# Patient Record
Sex: Female | Born: 1975 | Hispanic: Yes | Marital: Single | Smoking: Never smoker
Health system: Southern US, Community
[De-identification: ages and names within clinical notes are randomized; demographics above are authoritative.]

## PROBLEM LIST (undated history)

## (undated) HISTORY — PX: CHOLECYSTECTOMY: SHX55

---

## 2006-11-05 ENCOUNTER — Observation Stay: Payer: Self-pay

## 2006-11-05 ENCOUNTER — Ambulatory Visit: Payer: Self-pay | Admitting: Family Medicine

## 2007-09-25 ENCOUNTER — Emergency Department: Payer: Self-pay | Admitting: Emergency Medicine

## 2007-09-26 ENCOUNTER — Emergency Department: Payer: Self-pay | Admitting: Unknown Physician Specialty

## 2007-09-26 ENCOUNTER — Other Ambulatory Visit: Payer: Self-pay

## 2007-09-27 ENCOUNTER — Ambulatory Visit: Payer: Self-pay | Admitting: Obstetrics and Gynecology

## 2014-04-27 ENCOUNTER — Ambulatory Visit: Payer: Self-pay | Admitting: Family Medicine

## 2014-12-25 ENCOUNTER — Other Ambulatory Visit: Payer: Self-pay | Admitting: Family Medicine

## 2014-12-25 DIAGNOSIS — Z3481 Encounter for supervision of other normal pregnancy, first trimester: Secondary | ICD-10-CM

## 2014-12-29 ENCOUNTER — Ambulatory Visit
Admission: RE | Admit: 2014-12-29 | Discharge: 2014-12-29 | Disposition: A | Discharge status: Home / Self Care | Payer: Self-pay | Source: Ambulatory Visit | Attending: Family Medicine | Admitting: Family Medicine

## 2014-12-29 DIAGNOSIS — Z3401 Encounter for supervision of normal first pregnancy, first trimester: Secondary | ICD-10-CM | POA: Insufficient documentation

## 2014-12-29 DIAGNOSIS — Z3481 Encounter for supervision of other normal pregnancy, first trimester: Secondary | ICD-10-CM

## 2014-12-29 DIAGNOSIS — Z3A01 Less than 8 weeks gestation of pregnancy: Secondary | ICD-10-CM | POA: Insufficient documentation

## 2015-03-15 DIAGNOSIS — IMO0002 Reserved for concepts with insufficient information to code with codable children: Secondary | ICD-10-CM | POA: Insufficient documentation

## 2015-03-15 DIAGNOSIS — O09522 Supervision of elderly multigravida, second trimester: Secondary | ICD-10-CM | POA: Insufficient documentation

## 2019-05-13 ENCOUNTER — Ambulatory Visit: Payer: Self-pay

## 2019-05-13 ENCOUNTER — Ambulatory Visit: Payer: Self-pay | Attending: Internal Medicine

## 2019-05-13 DIAGNOSIS — Z23 Encounter for immunization: Secondary | ICD-10-CM

## 2019-05-13 NOTE — Progress Notes (Signed)
   Covid-19 Vaccination Clinic  Name:  Altair Stanko    MRN: [id number] DOB: 1975-06-17  05/13/2019  Ms. [name] Sondra Come was observed post Covid-19 immunization for 15 minutes without incident. She was provided with Vaccine Information Sheet and instruction to access the V-Safe system.   Ms. Tahlia Deamer was instructed to call 911 with any severe reactions post vaccine: Marland Kitchen Difficulty breathing  . Swelling of face and throat  . A fast heartbeat  . A bad rash all over body  . Dizziness and weakness   Immunizations Administered    Name Date Dose VIS Date Route   Pfizer COVID-19 Vaccine 05/13/2019  4:22 PM 0.3 mL 01/28/2019 Intramuscular   Manufacturer: [employer], [employer]   Lot: [id number]   NDC: [address]-5

## 2019-06-03 ENCOUNTER — Ambulatory Visit: Payer: Self-pay

## 2019-06-03 ENCOUNTER — Ambulatory Visit: Payer: Self-pay | Attending: Internal Medicine

## 2019-06-03 DIAGNOSIS — Z23 Encounter for immunization: Secondary | ICD-10-CM

## 2019-06-03 NOTE — Progress Notes (Signed)
   Covid-19 Vaccination Clinic  Name:  Abigail Chaney    MRN: [id number] DOB: February 27, 1975  06/03/2019  Ms. [name] Abigail Chaney was observed post Covid-19 immunization for 15 minutes without incident. She was provided with Vaccine Information Sheet and instruction to access the V-Safe system.   Ms. [name] was instructed to call 911 with any severe reactions post vaccine: Marland Kitchen Difficulty breathing  . Swelling of face and throat  . A fast heartbeat  . A bad rash all over body  . Dizziness and weakness   Immunizations Administered    Name Date Dose VIS Date Route   Pfizer COVID-19 Vaccine 06/03/2019  4:25 PM 0.3 mL 01/28/2019 Intramuscular   Manufacturer: [employer], [employer]   Lot: [id number]   NDC: [address]-9

## 2019-08-15 ENCOUNTER — Emergency Department: Payer: Self-pay

## 2019-08-15 ENCOUNTER — Encounter: Payer: Self-pay | Admitting: Emergency Medicine

## 2019-08-15 ENCOUNTER — Inpatient Hospital Stay
Admission: EM | Admit: 2019-08-15 | Discharge: 2019-08-17 | DRG: 419 | Disposition: A | Discharge status: Home / Self Care | Payer: Self-pay | Attending: General Surgery | Admitting: General Surgery

## 2019-08-15 ENCOUNTER — Other Ambulatory Visit: Payer: Self-pay

## 2019-08-15 DIAGNOSIS — K819 Cholecystitis, unspecified: Secondary | ICD-10-CM

## 2019-08-15 DIAGNOSIS — K8000 Calculus of gallbladder with acute cholecystitis without obstruction: Principal | ICD-10-CM | POA: Diagnosis present

## 2019-08-15 DIAGNOSIS — Z20822 Contact with and (suspected) exposure to covid-19: Secondary | ICD-10-CM | POA: Diagnosis present

## 2019-08-15 LAB — COMPREHENSIVE METABOLIC PANEL
ALT: 20 U/L (ref 0–44)
AST: 19 U/L (ref 15–41)
Albumin: 4.3 g/dL (ref 3.5–5.0)
Alkaline Phosphatase: 75 U/L (ref 38–126)
Anion gap: 10 (ref 5–15)
BUN: 8 mg/dL (ref 6–20)
CO2: 24 mmol/L (ref 22–32)
Calcium: 9 mg/dL (ref 8.9–10.3)
Chloride: 102 mmol/L (ref 98–111)
Creatinine, Ser: 0.51 mg/dL (ref 0.44–1.00)
GFR calc Af Amer: >60 mL/min (ref >60)
GFR calc non Af Amer: >60 mL/min (ref >60)
Glucose, Bld: 119 mg/dL — ABNORMAL HIGH (ref 70–99)
Potassium: 3.9 mmol/L (ref 3.5–5.1)
Sodium: 136 mmol/L (ref 135–145)
Total Bilirubin: 1 mg/dL (ref 0.3–1.2)
Total Protein: 8 g/dL (ref 6.5–8.1)

## 2019-08-15 LAB — CBC
HCT: 40.2 % (ref 36.0–46.0)
Hemoglobin: 13.7 g/dL (ref 12.0–15.0)
MCH: 29.3 pg (ref 26.0–34.0)
MCHC: 34.1 g/dL (ref 30.0–36.0)
MCV: 85.9 fL (ref 80.0–100.0)
Platelets: 266 10*3/uL (ref 150–400)
RBC: 4.68 MIL/uL (ref 3.87–5.11)
RDW: 12.5 % (ref 11.5–15.5)
WBC: 14.2 10*3/uL — ABNORMAL HIGH (ref 4.0–10.5)
nRBC: 0 % (ref 0.0–0.2)

## 2019-08-15 LAB — URINALYSIS, COMPLETE (UACMP) WITH MICROSCOPIC
Bacteria, UA: NONE SEEN
Bilirubin Urine: NEGATIVE
Glucose, UA: NEGATIVE mg/dL
Ketones, ur: NEGATIVE mg/dL
Leukocytes,Ua: NEGATIVE
Nitrite: NEGATIVE
Protein, ur: NEGATIVE mg/dL
Specific Gravity, Urine: 1.009 (ref 1.005–1.030)
pH: 7 (ref 5.0–8.0)

## 2019-08-15 LAB — LIPASE, BLOOD: Lipase: 24 U/L (ref 11–51)

## 2019-08-15 LAB — SARS CORONAVIRUS 2 BY RT PCR (HOSPITAL ORDER, PERFORMED IN ~~LOC~~ HOSPITAL LAB): SARS Coronavirus 2: NEGATIVE

## 2019-08-15 LAB — POCT PREGNANCY, URINE: Preg Test, Ur: NEGATIVE

## 2019-08-15 LAB — TROPONIN I (HIGH SENSITIVITY)
Troponin I (High Sensitivity): <2 ng/L (ref <18)
Troponin I (High Sensitivity): 4 ng/L (ref <18)

## 2019-08-15 MED ORDER — ONDANSETRON 4 MG PO TBDP: 4.0000 mg | ORAL_TABLET | Freq: Four times a day (QID) | ORAL | Status: DC | PRN

## 2019-08-15 MED ORDER — KETOROLAC TROMETHAMINE 30 MG/ML IJ SOLN
30.0000 mg | Freq: Four times a day (QID) | INTRAMUSCULAR | Status: DC | PRN
  Administered 2019-08-16 – 2019-08-17 (×2): 30 mg via INTRAVENOUS
  Filled 2019-08-15 (×2): qty 1

## 2019-08-15 MED ORDER — MORPHINE SULFATE (PF) 4 MG/ML IV SOLN
4.0000 mg | Freq: Once | INTRAVENOUS | Status: AC
  Administered 2019-08-15: 4 mg via INTRAVENOUS
  Filled 2019-08-15: qty 1

## 2019-08-15 MED ORDER — HYDROMORPHONE HCL 1 MG/ML IJ SOLN
INTRAMUSCULAR | Status: AC
  Filled 2019-08-15: qty 1

## 2019-08-15 MED ORDER — SIMETHICONE 80 MG PO CHEW
40.0000 mg | CHEWABLE_TABLET | Freq: Four times a day (QID) | ORAL | Status: DC | PRN
  Filled 2019-08-15: qty 1

## 2019-08-15 MED ORDER — PIPERACILLIN-TAZOBACTAM 4.5 G IVPB: 4.5000 g | Freq: Once | INTRAVENOUS | Status: DC

## 2019-08-15 MED ORDER — ONDANSETRON HCL 4 MG/2ML IJ SOLN: 4.0000 mg | Freq: Four times a day (QID) | INTRAMUSCULAR | Status: DC | PRN

## 2019-08-15 MED ORDER — IOHEXOL 350 MG/ML SOLN
75.0000 mL | Freq: Once | INTRAVENOUS | Status: AC | PRN
  Administered 2019-08-15: 75 mL via INTRAVENOUS
  Filled 2019-08-15: qty 75

## 2019-08-15 MED ORDER — ONDANSETRON HCL 4 MG/2ML IJ SOLN
4.0000 mg | Freq: Once | INTRAMUSCULAR | Status: AC
  Administered 2019-08-15: 4 mg via INTRAVENOUS
  Filled 2019-08-15: qty 2

## 2019-08-15 MED ORDER — HYDROMORPHONE HCL 1 MG/ML IJ SOLN
0.5000 mg | INTRAMUSCULAR | Status: DC | PRN
  Administered 2019-08-15 – 2019-08-16 (×5): 0.5 mg via INTRAVENOUS
  Filled 2019-08-15 (×4): qty 0.5

## 2019-08-15 MED ORDER — PIPERACILLIN-TAZOBACTAM 3.375 G IVPB
3.3750 g | Freq: Three times a day (TID) | INTRAVENOUS | Status: DC
  Administered 2019-08-15 – 2019-08-17 (×4): 3.375 g via INTRAVENOUS
  Filled 2019-08-15 (×4): qty 50

## 2019-08-15 NOTE — ED Triage Notes (Signed)
Through hospital spanish interpreter:  Patient presents to the ED with bilateral flank pain radiating into lower abdomen x 3 days.  Patient reports urinary frequency, denies dysuria.  Patient denies vomiting and diarrhea.  Patient appears anxious in triage and patient states she is very short of breath.  Patient began complaining of tightness in her chest and shortness of breath as well during triage.  Patient also tearful.

## 2019-08-15 NOTE — ED Notes (Signed)
Attempted to call report at this time 

## 2019-08-15 NOTE — ED Provider Notes (Signed)
Emergency Department Provider Note  ____________________________________________  Time seen: Approximately 7:12 PM  I have reviewed the triage vital signs and the nursing notes.   HISTORY  Chief Complaint Flank Pain and Chest Pain   Historian Patient     HPI Abigail Chaney is a 44 y.o. female presents to the emergency department with bilateral flank pain that radiates to the chest for the past two days.  Patient states that pain was radiating into her abdomen yesterday but states that she has mostly feeling pain in her upper chest.  She states that pain makes her breathless. She has not been able to eat due to the pain. She denies fever and chills.  No nausea, vomiting or diarrhea.  No dysuria or hematuria at home.  She states that she was seen by her primary care provider and was diagnosed with musculoskeletal pain.  She states that she has had similar pain in the past but cannot remember how it was managed.  She denies recent admissions and states that overall she is pretty healthy.  She denies swelling in the bilateral legs.  Denies recent travel, daily smoking or recent surgery.  Denies a history of DVT or PE.   History reviewed. No pertinent past medical history.   Immunizations up to date:  Yes.     History reviewed. No pertinent past medical history.  Patient Active Problem List   Diagnosis Date Noted  . Acute calculous cholecystitis 08/15/2019    History reviewed. No pertinent surgical history.  Prior to Admission medications   Not on File    Allergies Patient has no known allergies.  No family history on file.  Social History Social History   Tobacco Use  . Smoking status: Not on file  Substance Use Topics  . Alcohol use: Not on file  . Drug use: Not on file     Review of Systems  Constitutional: No fever/chills Eyes:  No discharge ENT: No upper respiratory complaints. Respiratory: no cough. No SOB/ use of accessory muscles to  breath Cardiovascular: Patient has chest pain.  Gastrointestinal:   No nausea, no vomiting.  No diarrhea.  No constipation. Musculoskeletal: Negative for musculoskeletal pain. Skin: Negative for rash, abrasions, lacerations, ecchymosis.   ____________________________________________   PHYSICAL EXAM:  VITAL SIGNS: ED Triage Vitals  Enc Vitals Group     BP 08/15/19 1536 (!) 175/89     Pulse Rate 08/15/19 1536 (!) 117     Resp 08/15/19 1536 18     Temp 08/15/19 1536 98.7 F (37.1 C)     Temp Source 08/15/19 1536 Oral     SpO2 08/15/19 1536 98 %     Weight 08/15/19 1536 165 lb (74.8 kg)     Height 08/15/19 1536 5\' 5"  (1.651 m)     Head Circumference --      Peak Flow --      Pain Score 08/15/19 1535 9     Pain Loc --      Pain Edu? --      Excl. in GC? --      Constitutional: Alert and oriented.  Patient seems uncomfortable and leaning forward. Eyes: Conjunctivae are normal. PERRL. EOMI. Head: Atraumatic. ENT:      Nose: No congestion/rhinnorhea.      Mouth/Throat: Mucous membranes are moist.  Neck: No stridor.  No cervical spine tenderness to palpation. Cardiovascular: Normal rate, regular rhythm. Normal S1 and S2.  Good peripheral circulation. Respiratory: Normal respiratory effort without tachypnea or retractions.  Lungs CTAB. Good air entry to the bases with no decreased or absent breath sounds Gastrointestinal: Bowel sounds x 4 quadrants.  Patient has tenderness to palpation in right upper and left upper abdominal quadrants with no guarding or rigidity. No distention. Musculoskeletal: Full range of motion to all extremities. No obvious deformities noted Neurologic:  Normal for age. No gross focal neurologic deficits are appreciated.  Skin:  Skin is warm, dry and intact. No rash noted. Psychiatric: Mood and affect are normal for age. Speech and behavior are normal.   ____________________________________________   LABS (all labs ordered are listed, but only abnormal  results are displayed)  Labs Reviewed  CBC - Abnormal; Notable for the following components:      Result Value   WBC 14.2 (*)    All other components within normal limits  URINALYSIS, COMPLETE (UACMP) WITH MICROSCOPIC - Abnormal; Notable for the following components:   Color, Urine STRAW (*)    APPearance CLEAR (*)    Hgb urine dipstick SMALL (*)    All other components within normal limits  COMPREHENSIVE METABOLIC PANEL - Abnormal; Notable for the following components:   Glucose, Bld 119 (*)    All other components within normal limits  SARS CORONAVIRUS 2 BY RT PCR (HOSPITAL ORDER, PERFORMED IN [facility])  LIPASE, BLOOD  HIV ANTIBODY (ROUTINE TESTING W REFLEX)  CBC  COMPREHENSIVE METABOLIC PANEL  MAGNESIUM  PHOSPHORUS  POC URINE PREG, ED  POCT PREGNANCY, URINE  TROPONIN I (HIGH SENSITIVITY)  TROPONIN I (HIGH SENSITIVITY)   ____________________________________________  EKG   ____________________________________________  RADIOLOGY Geraldo Pitter, personally viewed and evaluated these images (plain radiographs) as part of my medical decision making, as well as reviewing the written report by the radiologist.  DG Chest 2 View  Result Date: 08/15/2019 CLINICAL DATA:  Chest pain. Additional provided: Bilateral flank pain radiating into lower abdomen for 3 days, urinary frequency. EXAM: CHEST - 2 VIEW COMPARISON:  Prior chest radiograph 04/27/2014 FINDINGS: Heart size within normal limits. There is no appreciable airspace consolidation. No evidence of pleural effusion or pneumothorax. No acute bony abnormality identified. IMPRESSION: No evidence of acute cardiopulmonary abnormality. Electronically Signed   By: Jackey Loge DO   On: 08/15/2019 16:23   CT Angio Chest PE W and/or Wo Contrast  Result Date: 08/15/2019 CLINICAL DATA:  Abdominal distension. Shortness of breath. Flank pain and epigastric pain. EXAM: CT ANGIOGRAPHY CHEST CT ABDOMEN AND PELVIS WITH  CONTRAST TECHNIQUE: Multidetector CT imaging of the chest was performed using the standard protocol during bolus administration of intravenous contrast. Multiplanar CT image reconstructions and MIPs were obtained to evaluate the vascular anatomy. Multidetector CT imaging of the abdomen and pelvis was performed using the standard protocol during bolus administration of intravenous contrast. CONTRAST:  63mL OMNIPAQUE IOHEXOL 350 MG/ML SOLN COMPARISON:  None. FINDINGS: CTA CHEST FINDINGS Cardiovascular: Examination is slightly limited by respiratory motion artifact. There is no pulmonary embolus or evidence of right heart strain. The size of the main pulmonary artery is normal. Heart size is normal, with no pericardial effusion. The course and caliber of the aorta are normal. There is no atherosclerotic calcification. Opacification decreased due to pulmonary arterial phase contrast bolus timing. Mediastinum/Nodes: -- No mediastinal lymphadenopathy. -- No hilar lymphadenopathy. -- No axillary lymphadenopathy. -- No supraclavicular lymphadenopathy. -- Normal thyroid gland where visualized. -  Unremarkable esophagus. Lungs/Pleura: Airways are patent. No pleural effusion, lobar consolidation, pneumothorax or pulmonary infarction. Musculoskeletal: No chest wall abnormality. No bony  spinal canal stenosis. Review of the MIP images confirms the above findings. CT ABDOMEN and PELVIS FINDINGS Hepatobiliary: The liver is normal. The gallbladder is distended. Multiple gallstones are noted. There appears to be some gallbladder wall thickening.There is no biliary ductal dilation. Pancreas: Normal contours without ductal dilatation. No peripancreatic fluid collection. Spleen: Unremarkable. Adrenals/Urinary Tract: --Adrenal glands: Unremarkable. --Right kidney/ureter: No hydronephrosis or radiopaque kidney stones. --Left kidney/ureter: No hydronephrosis or radiopaque kidney stones. --Urinary bladder: Unremarkable. Stomach/Bowel:  --Stomach/Duodenum: No hiatal hernia or other gastric abnormality. Normal duodenal course and caliber. --Small bowel: Unremarkable. --Colon: Unremarkable. --Appendix: Normal. Vascular/Lymphatic: Normal course and caliber of the major abdominal vessels. --No retroperitoneal lymphadenopathy. --No mesenteric lymphadenopathy. --No pelvic or inguinal lymphadenopathy. Reproductive: There is an IUD in place. Other: No ascites or free air. The abdominal wall is normal. Musculoskeletal. No acute displaced fractures. Review of the MIP images confirms the above findings. IMPRESSION: 1. Cholelithiasis with findings suspicious for acute cholecystitis. Follow-up with ultrasound is recommended. 2. No acute pulmonary embolism.  The lungs are clear. Electronically Signed   By: Katherine Mantle M.D.   On: 08/15/2019 20:34   CT ABDOMEN PELVIS W CONTRAST  Result Date: 08/15/2019 CLINICAL DATA:  Abdominal distension. Shortness of breath. Flank pain and epigastric pain. EXAM: CT ANGIOGRAPHY CHEST CT ABDOMEN AND PELVIS WITH CONTRAST TECHNIQUE: Multidetector CT imaging of the chest was performed using the standard protocol during bolus administration of intravenous contrast. Multiplanar CT image reconstructions and MIPs were obtained to evaluate the vascular anatomy. Multidetector CT imaging of the abdomen and pelvis was performed using the standard protocol during bolus administration of intravenous contrast. CONTRAST:  19mL OMNIPAQUE IOHEXOL 350 MG/ML SOLN COMPARISON:  None. FINDINGS: CTA CHEST FINDINGS Cardiovascular: Examination is slightly limited by respiratory motion artifact. There is no pulmonary embolus or evidence of right heart strain. The size of the main pulmonary artery is normal. Heart size is normal, with no pericardial effusion. The course and caliber of the aorta are normal. There is no atherosclerotic calcification. Opacification decreased due to pulmonary arterial phase contrast bolus timing. Mediastinum/Nodes:  -- No mediastinal lymphadenopathy. -- No hilar lymphadenopathy. -- No axillary lymphadenopathy. -- No supraclavicular lymphadenopathy. -- Normal thyroid gland where visualized. -  Unremarkable esophagus. Lungs/Pleura: Airways are patent. No pleural effusion, lobar consolidation, pneumothorax or pulmonary infarction. Musculoskeletal: No chest wall abnormality. No bony spinal canal stenosis. Review of the MIP images confirms the above findings. CT ABDOMEN and PELVIS FINDINGS Hepatobiliary: The liver is normal. The gallbladder is distended. Multiple gallstones are noted. There appears to be some gallbladder wall thickening.There is no biliary ductal dilation. Pancreas: Normal contours without ductal dilatation. No peripancreatic fluid collection. Spleen: Unremarkable. Adrenals/Urinary Tract: --Adrenal glands: Unremarkable. --Right kidney/ureter: No hydronephrosis or radiopaque kidney stones. --Left kidney/ureter: No hydronephrosis or radiopaque kidney stones. --Urinary bladder: Unremarkable. Stomach/Bowel: --Stomach/Duodenum: No hiatal hernia or other gastric abnormality. Normal duodenal course and caliber. --Small bowel: Unremarkable. --Colon: Unremarkable. --Appendix: Normal. Vascular/Lymphatic: Normal course and caliber of the major abdominal vessels. --No retroperitoneal lymphadenopathy. --No mesenteric lymphadenopathy. --No pelvic or inguinal lymphadenopathy. Reproductive: There is an IUD in place. Other: No ascites or free air. The abdominal wall is normal. Musculoskeletal. No acute displaced fractures. Review of the MIP images confirms the above findings. IMPRESSION: 1. Cholelithiasis with findings suspicious for acute cholecystitis. Follow-up with ultrasound is recommended. 2. No acute pulmonary embolism.  The lungs are clear. Electronically Signed   By: Katherine Mantle M.D.   On: 08/15/2019 20:34    ____________________________________________  PROCEDURES  Procedure(s) performed:      Procedures     Medications  piperacillin-tazobactam (ZOSYN) IVPB 3.375 g (has no administration in time range)  ketorolac (TORADOL) 30 MG/ML injection 30 mg (has no administration in time range)  HYDROmorphone (DILAUDID) injection 0.5 mg (has no administration in time range)  ondansetron (ZOFRAN-ODT) disintegrating tablet 4 mg (has no administration in time range)    Or  ondansetron (ZOFRAN) injection 4 mg (has no administration in time range)  simethicone (MYLICON) chewable tablet 40 mg (has no administration in time range)  morphine 4 MG/ML injection 4 mg (4 mg Intravenous Given 08/15/19 1922)  ondansetron (ZOFRAN) injection 4 mg (4 mg Intravenous Given 08/15/19 1922)  iohexol (OMNIPAQUE) 350 MG/ML injection 75 mL (75 mLs Intravenous Contrast Given 08/15/19 2009)     ____________________________________________   INITIAL IMPRESSION / ASSESSMENT AND PLAN / ED COURSE  Pertinent labs & imaging results that were available during my care of the patient were reviewed by me and considered in my medical decision making (see chart for details).      Assessment and plan:  Cholelithiasis with cholecystitis 44 year old female presents to the emergency department with bilateral flank pain that radiates to the chest for the past 2 days.  Patient was hypertensive and tachycardic in triage but her vital signs were otherwise stable.  On physical exam, she did have tenderness to palpation the right and left upper abdominal quadrants.  Leukocytosis was noted on CBC.  CMP and lipase were reassuring.  No evidence of cystitis on urinalysis.  CT abdomen pelvis revealed gallbladder wall thickening with gallbladder distention.  Dr. Lady Garyannon, general surgeon on-call was consulted.  Very much appreciate consult.  Dr. Lady Garyannon recommended admission for impending cholecystectomy.  Zosyn was given in emergency department  ____________________________________________  FINAL CLINICAL IMPRESSION(S) / ED  DIAGNOSES  Final diagnoses:  Cholecystitis      NEW MEDICATIONS STARTED DURING THIS VISIT:  ED Discharge Orders    None          This chart was dictated using voice recognition software/Dragon. Despite best efforts to proofread, errors can occur which can change the meaning. Any change was purely unintentional.     Gasper LloydWoods, Kevin Mario M, PA-C 08/15/19 2117    Sharman CheekStafford, Phillip, MD 08/15/19 2146

## 2019-08-15 NOTE — ED Notes (Signed)
Pt from home with flank and epigastric/chest pain. Denies fever, n/v/d. Pt endorses some difficulty catching her breath, states she feels pressure in chest; states it feels better when she leans forward. Pt states pain was lower in abdomen yesterday; today more in chest. Pt has been unable to eat. Pt alert & oriented.

## 2019-08-15 NOTE — Plan of Care (Signed)
44 y/o F presenting to the ED with 2 day history of abdominal/flank/chest pain.  Pan CT showed cholelithiasis with concern for acute cholecystitis. Pt with leukocytosis, but otherwise fairly normal labs.  Will admit, make NPO after MN. IVF and IV Abx.  Plan for lap chole pending OR/anesthesia/surgeon availability tomorrow or Wednesday. Full H&P to follow.

## 2019-08-16 ENCOUNTER — Inpatient Hospital Stay: Payer: Self-pay | Admitting: Anesthesiology

## 2019-08-16 ENCOUNTER — Encounter: Payer: Self-pay | Admitting: General Surgery

## 2019-08-16 ENCOUNTER — Encounter
Admission: EM | Disposition: A | Discharge status: Home / Self Care | Payer: Self-pay | Source: Home / Self Care | Attending: General Surgery

## 2019-08-16 DIAGNOSIS — K819 Cholecystitis, unspecified: Secondary | ICD-10-CM

## 2019-08-16 DIAGNOSIS — K8000 Calculus of gallbladder with acute cholecystitis without obstruction: Principal | ICD-10-CM

## 2019-08-16 HISTORY — PX: LAPAROSCOPIC APPENDECTOMY: SHX408

## 2019-08-16 LAB — CBC
HCT: 39.1 % (ref 36.0–46.0)
HCT: 37.2 % (ref 36.0–46.0)
Hemoglobin: 13.8 g/dL (ref 12.0–15.0)
Hemoglobin: 13 g/dL (ref 12.0–15.0)
MCH: 30.1 pg (ref 26.0–34.0)
MCH: 30.1 pg (ref 26.0–34.0)
MCHC: 35.3 g/dL (ref 30.0–36.0)
MCHC: 34.9 g/dL (ref 30.0–36.0)
MCV: 85.4 fL (ref 80.0–100.0)
MCV: 86.1 fL (ref 80.0–100.0)
Platelets: 249 10*3/uL (ref 150–400)
Platelets: 236 10*3/uL (ref 150–400)
RBC: 4.58 MIL/uL (ref 3.87–5.11)
RBC: 4.32 MIL/uL (ref 3.87–5.11)
RDW: 12.5 % (ref 11.5–15.5)
RDW: 12.6 % (ref 11.5–15.5)
WBC: 16.6 10*3/uL — ABNORMAL HIGH (ref 4.0–10.5)
WBC: 13.5 10*3/uL — ABNORMAL HIGH (ref 4.0–10.5)
nRBC: 0 % (ref 0.0–0.2)
nRBC: 0 % (ref 0.0–0.2)

## 2019-08-16 LAB — COMPREHENSIVE METABOLIC PANEL
ALT: 18 U/L (ref 0–44)
ALT: 47 U/L — ABNORMAL HIGH (ref 0–44)
AST: 19 U/L (ref 15–41)
AST: 57 U/L — ABNORMAL HIGH (ref 15–41)
Albumin: 4 g/dL (ref 3.5–5.0)
Albumin: 3.6 g/dL (ref 3.5–5.0)
Alkaline Phosphatase: 76 U/L (ref 38–126)
Alkaline Phosphatase: 84 U/L (ref 38–126)
Anion gap: 8 (ref 5–15)
Anion gap: 7 (ref 5–15)
BUN: 9 mg/dL (ref 6–20)
BUN: 10 mg/dL (ref 6–20)
CO2: 27 mmol/L (ref 22–32)
CO2: 25 mmol/L (ref 22–32)
Calcium: 9.2 mg/dL (ref 8.9–10.3)
Calcium: 8.8 mg/dL — ABNORMAL LOW (ref 8.9–10.3)
Chloride: 101 mmol/L (ref 98–111)
Chloride: 103 mmol/L (ref 98–111)
Creatinine, Ser: 0.56 mg/dL (ref 0.44–1.00)
Creatinine, Ser: 0.56 mg/dL (ref 0.44–1.00)
GFR calc Af Amer: >60 mL/min (ref >60)
GFR calc Af Amer: >60 mL/min (ref >60)
GFR calc non Af Amer: >60 mL/min (ref >60)
GFR calc non Af Amer: >60 mL/min (ref >60)
Glucose, Bld: 136 mg/dL — ABNORMAL HIGH (ref 70–99)
Glucose, Bld: 142 mg/dL — ABNORMAL HIGH (ref 70–99)
Potassium: 4.1 mmol/L (ref 3.5–5.1)
Potassium: 4.2 mmol/L (ref 3.5–5.1)
Sodium: 136 mmol/L (ref 135–145)
Sodium: 135 mmol/L (ref 135–145)
Total Bilirubin: 1.5 mg/dL — ABNORMAL HIGH (ref 0.3–1.2)
Total Bilirubin: 1.6 mg/dL — ABNORMAL HIGH (ref 0.3–1.2)
Total Protein: 7.8 g/dL (ref 6.5–8.1)
Total Protein: 7.2 g/dL (ref 6.5–8.1)

## 2019-08-16 LAB — MAGNESIUM: Magnesium: 2.1 mg/dL (ref 1.7–2.4)

## 2019-08-16 LAB — SURGICAL PCR SCREEN
MRSA, PCR: NEGATIVE
Staphylococcus aureus: NEGATIVE

## 2019-08-16 LAB — HIV ANTIBODY (ROUTINE TESTING W REFLEX): HIV Screen 4th Generation wRfx: NONREACTIVE

## 2019-08-16 LAB — PHOSPHORUS: Phosphorus: 3.3 mg/dL (ref 2.5–4.6)

## 2019-08-16 SURGERY — APPENDECTOMY, LAPAROSCOPIC
Anesthesia: General

## 2019-08-16 MED ORDER — PIPERACILLIN-TAZOBACTAM 3.375 G IVPB
INTRAVENOUS | Status: AC
  Administered 2019-08-16: 3.375 g via INTRAVENOUS
  Filled 2019-08-16: qty 50

## 2019-08-16 MED ORDER — DEXMEDETOMIDINE HCL IN NACL 80 MCG/20ML IV SOLN
INTRAVENOUS | Status: AC
  Filled 2019-08-16: qty 20

## 2019-08-16 MED ORDER — GLYCOPYRROLATE 0.2 MG/ML IJ SOLN
INTRAMUSCULAR | Status: DC | PRN
  Administered 2019-08-16: .2 mg via INTRAVENOUS

## 2019-08-16 MED ORDER — CHLORHEXIDINE GLUCONATE CLOTH 2 % EX PADS
6.0000 | MEDICATED_PAD | Freq: Every day | CUTANEOUS | Status: DC
  Administered 2019-08-16: 6 via TOPICAL

## 2019-08-16 MED ORDER — FENTANYL CITRATE (PF) 100 MCG/2ML IJ SOLN
INTRAMUSCULAR | Status: AC
  Filled 2019-08-16: qty 2

## 2019-08-16 MED ORDER — DEXAMETHASONE SODIUM PHOSPHATE 10 MG/ML IJ SOLN
INTRAMUSCULAR | Status: AC
  Filled 2019-08-16: qty 1

## 2019-08-16 MED ORDER — ONDANSETRON HCL 4 MG/2ML IJ SOLN
INTRAMUSCULAR | Status: AC
  Filled 2019-08-16: qty 2

## 2019-08-16 MED ORDER — ACETAMINOPHEN 10 MG/ML IV SOLN: 1000.0000 mg | Freq: Once | INTRAVENOUS | Status: DC | PRN

## 2019-08-16 MED ORDER — FENTANYL CITRATE (PF) 100 MCG/2ML IJ SOLN: 25.0000 ug | INTRAMUSCULAR | Status: DC | PRN

## 2019-08-16 MED ORDER — GLYCOPYRROLATE 0.2 MG/ML IJ SOLN
INTRAMUSCULAR | Status: AC
  Filled 2019-08-16: qty 1

## 2019-08-16 MED ORDER — ACETAMINOPHEN 500 MG PO TABS
1000.0000 mg | ORAL_TABLET | Freq: Four times a day (QID) | ORAL | Status: DC
  Administered 2019-08-16 – 2019-08-17 (×2): 1000 mg via ORAL
  Filled 2019-08-16 (×3): qty 2

## 2019-08-16 MED ORDER — PHENYLEPHRINE HCL (PRESSORS) 10 MG/ML IV SOLN
INTRAVENOUS | Status: DC | PRN
  Administered 2019-08-16: 100 ug via INTRAVENOUS

## 2019-08-16 MED ORDER — SUGAMMADEX SODIUM 500 MG/5ML IV SOLN
INTRAVENOUS | Status: AC
  Filled 2019-08-16: qty 5

## 2019-08-16 MED ORDER — PROPOFOL 500 MG/50ML IV EMUL
INTRAVENOUS | Status: DC | PRN
  Administered 2019-08-16: 175 ug/kg/min via INTRAVENOUS

## 2019-08-16 MED ORDER — LIDOCAINE-EPINEPHRINE 1 %-1:100000 IJ SOLN
INTRAMUSCULAR | Status: DC | PRN
  Administered 2019-08-16: 15 mL

## 2019-08-16 MED ORDER — DEXMEDETOMIDINE HCL IN NACL 400 MCG/100ML IV SOLN
INTRAVENOUS | Status: DC | PRN
Start: 2019-08-16 — End: 2019-08-16
  Administered 2019-08-16 (×2): 4 ug via INTRAVENOUS
  Administered 2019-08-16: 12 ug via INTRAVENOUS

## 2019-08-16 MED ORDER — DEXAMETHASONE SODIUM PHOSPHATE 10 MG/ML IJ SOLN
INTRAMUSCULAR | Status: DC | PRN
  Administered 2019-08-16: 10 mg via INTRAVENOUS

## 2019-08-16 MED ORDER — LIDOCAINE HCL (PF) 2 % IJ SOLN
INTRAMUSCULAR | Status: AC
  Filled 2019-08-16: qty 5

## 2019-08-16 MED ORDER — FENTANYL CITRATE (PF) 100 MCG/2ML IJ SOLN
INTRAMUSCULAR | Status: DC | PRN
  Administered 2019-08-16 (×3): 50 ug via INTRAVENOUS

## 2019-08-16 MED ORDER — ONDANSETRON HCL 4 MG/2ML IJ SOLN
INTRAMUSCULAR | Status: DC | PRN
  Administered 2019-08-16: 4 mg via INTRAVENOUS

## 2019-08-16 MED ORDER — LACTATED RINGERS IV SOLN: INTRAVENOUS | Status: DC

## 2019-08-16 MED ORDER — LIDOCAINE HCL (CARDIAC) PF 100 MG/5ML IV SOSY
PREFILLED_SYRINGE | INTRAVENOUS | Status: DC | PRN
  Administered 2019-08-16: 100 mg via INTRAVENOUS

## 2019-08-16 MED ORDER — MIDAZOLAM HCL 2 MG/2ML IJ SOLN
INTRAMUSCULAR | Status: AC
  Filled 2019-08-16: qty 2

## 2019-08-16 MED ORDER — OXYCODONE HCL 5 MG PO TABS: 5.0000 mg | ORAL_TABLET | Freq: Once | ORAL | Status: DC | PRN

## 2019-08-16 MED ORDER — OXYCODONE HCL 5 MG/5ML PO SOLN: 5.0000 mg | Freq: Once | ORAL | Status: DC | PRN

## 2019-08-16 MED ORDER — PROPOFOL 10 MG/ML IV BOLUS
INTRAVENOUS | Status: DC | PRN
  Administered 2019-08-16: 150 mg via INTRAVENOUS
  Administered 2019-08-16: 50 mg via INTRAVENOUS

## 2019-08-16 MED ORDER — ONDANSETRON HCL 4 MG/2ML IJ SOLN: 4.0000 mg | Freq: Once | INTRAMUSCULAR | Status: DC | PRN

## 2019-08-16 MED ORDER — OXYCODONE HCL 5 MG PO TABS
5.0000 mg | ORAL_TABLET | ORAL | Status: DC | PRN
  Administered 2019-08-17: 5 mg via ORAL
  Filled 2019-08-16: qty 1

## 2019-08-16 MED ORDER — PROPOFOL 500 MG/50ML IV EMUL
INTRAVENOUS | Status: AC
  Filled 2019-08-16: qty 100

## 2019-08-16 MED ORDER — ROCURONIUM BROMIDE 10 MG/ML (PF) SYRINGE
PREFILLED_SYRINGE | INTRAVENOUS | Status: AC
  Filled 2019-08-16: qty 10

## 2019-08-16 MED ORDER — "VISTASEAL 4 ML SINGLE DOSE KIT "
PACK | CUTANEOUS | Status: DC | PRN
  Administered 2019-08-16: 4 mL via TOPICAL

## 2019-08-16 MED ORDER — ACETAMINOPHEN 10 MG/ML IV SOLN
INTRAVENOUS | Status: DC | PRN
  Administered 2019-08-16: 1000 mg via INTRAVENOUS

## 2019-08-16 MED ORDER — MIDAZOLAM HCL 2 MG/2ML IJ SOLN
INTRAMUSCULAR | Status: DC | PRN
  Administered 2019-08-16: 2 mg via INTRAVENOUS

## 2019-08-16 MED ORDER — PROPOFOL 10 MG/ML IV BOLUS
INTRAVENOUS | Status: AC
  Filled 2019-08-16: qty 20

## 2019-08-16 MED ORDER — SUGAMMADEX SODIUM 200 MG/2ML IV SOLN
INTRAVENOUS | Status: DC | PRN
  Administered 2019-08-16: 149.6 mg via INTRAVENOUS

## 2019-08-16 MED ORDER — ROCURONIUM BROMIDE 100 MG/10ML IV SOLN
INTRAVENOUS | Status: DC | PRN
  Administered 2019-08-16: 50 mg via INTRAVENOUS
  Administered 2019-08-16: 20 mg via INTRAVENOUS

## 2019-08-16 MED ORDER — HEMOSTATIC AGENTS (NO CHARGE) OPTIME
TOPICAL | Status: DC | PRN
  Administered 2019-08-16: 1 via TOPICAL

## 2019-08-16 MED ORDER — SUCCINYLCHOLINE CHLORIDE 20 MG/ML IJ SOLN
INTRAMUSCULAR | Status: DC | PRN
  Administered 2019-08-16: 50 mg via INTRAVENOUS

## 2019-08-16 SURGICAL SUPPLY — 54 items
APPLICATOR COTTON TIP 6 STRL (MISCELLANEOUS) ×1 IMPLANT
APPLICATOR COTTON TIP 6IN STRL (MISCELLANEOUS) ×2
APPLICATOR VISTASEAL 35 (MISCELLANEOUS) ×2 IMPLANT
APPLIER CLIP 5 13 M/L LIGAMAX5 (MISCELLANEOUS) ×2
BLADE CLIPPER SURG (BLADE) IMPLANT
BLADE SURG SZ11 CARB STEEL (BLADE) ×2 IMPLANT
CANISTER SUCT 1200ML W/VALVE (MISCELLANEOUS) ×2 IMPLANT
CHLORAPREP W/TINT 26 (MISCELLANEOUS) ×2 IMPLANT
CLIP APPLIE 5 13 M/L LIGAMAX5 (MISCELLANEOUS) ×1 IMPLANT
COVER WAND RF STERILE (DRAPES) ×2 IMPLANT
CUTTER FLEX LINEAR 45M (STAPLE) IMPLANT
DEFOGGER SCOPE WARMER CLEARIFY (MISCELLANEOUS) ×2 IMPLANT
DERMABOND ADVANCED (GAUZE/BANDAGES/DRESSINGS) ×1
DERMABOND ADVANCED .7 DNX12 (GAUZE/BANDAGES/DRESSINGS) ×1 IMPLANT
ELECT CAUTERY BLADE TIP 2.5 (TIP) ×2
ELECT REM PT RETURN 9FT ADLT (ELECTROSURGICAL) ×2
ELECTRODE CAUTERY BLDE TIP 2.5 (TIP) ×1 IMPLANT
ELECTRODE REM PT RTRN 9FT ADLT (ELECTROSURGICAL) ×1 IMPLANT
FILTER LAP SMOKE EVAC STRL (MISCELLANEOUS) ×2 IMPLANT
GLOVE BIO SURGEON STRL SZ 6.5 (GLOVE) ×2 IMPLANT
GLOVE INDICATOR 7.0 STRL GRN (GLOVE) ×2 IMPLANT
GOWN STRL REUS W/ TWL LRG LVL3 (GOWN DISPOSABLE) ×2 IMPLANT
GOWN STRL REUS W/TWL LRG LVL3 (GOWN DISPOSABLE) ×2
GRASPER SUT TROCAR 14GX15 (MISCELLANEOUS) ×2 IMPLANT
IRRIGATION STRYKERFLOW (MISCELLANEOUS) ×1 IMPLANT
IRRIGATOR STRYKERFLOW (MISCELLANEOUS) ×2
IV NS 1000ML (IV SOLUTION) ×1
IV NS 1000ML BAXH (IV SOLUTION) ×1 IMPLANT
KIT TURNOVER KIT A (KITS) ×2 IMPLANT
KITTNER LAPARASCOPIC 5X40 (MISCELLANEOUS) ×2 IMPLANT
LABEL OR SOLS (LABEL) ×2 IMPLANT
LIGASURE LAP MARYLAND 5MM 37CM (ELECTROSURGICAL) IMPLANT
NEEDLE HYPO 22GX1.5 SAFETY (NEEDLE) ×2 IMPLANT
NS IRRIG 500ML POUR BTL (IV SOLUTION) ×2 IMPLANT
PACK LAP CHOLECYSTECTOMY (MISCELLANEOUS) ×2 IMPLANT
PENCIL ELECTRO HAND CTR (MISCELLANEOUS) ×2 IMPLANT
POUCH SPECIMEN RETRIEVAL 10MM (ENDOMECHANICALS) ×2 IMPLANT
RELOAD STAPLE TA45 3.5 REG BLU (ENDOMECHANICALS) IMPLANT
SCISSORS METZENBAUM CVD 33 (INSTRUMENTS) ×2 IMPLANT
SET TUBE SMOKE EVAC HIGH FLOW (TUBING) ×2 IMPLANT
SHEARS HARMONIC ACE PLUS 36CM (ENDOMECHANICALS) IMPLANT
SLEEVE ADV FIXATION 5X100MM (TROCAR) ×6 IMPLANT
STRIP CLOSURE SKIN 1/2X4 (GAUZE/BANDAGES/DRESSINGS) ×2 IMPLANT
SUT MNCRL 4-0 (SUTURE) ×1
SUT MNCRL 4-0 27XMFL (SUTURE) ×1
SUT VIC AB 3-0 SH 27 (SUTURE) ×1
SUT VIC AB 3-0 SH 27X BRD (SUTURE) ×1 IMPLANT
SUT VICRYL 0 AB UR-6 (SUTURE) ×4 IMPLANT
SUTURE MNCRL 4-0 27XMF (SUTURE) ×1 IMPLANT
SYS KII FIOS ACCESS ABD 5X100 (TROCAR) ×2
SYSTEM KII FIOS ACES ABD 5X100 (TROCAR) ×1 IMPLANT
TRAY FOLEY MTR SLVR 16FR STAT (SET/KITS/TRAYS/PACK) IMPLANT
TROCAR 130MM GELPORT  DAV (MISCELLANEOUS) ×2 IMPLANT
TROCAR ADV FIXATION 12X100MM (TROCAR) IMPLANT

## 2019-08-16 NOTE — H&P (Addendum)
[facility] SURGICAL HISTORY & PHYSICAL (cpt [phone])  HISTORY OF PRESENT ILLNESS (HPI):  44 y.o. female presented to [facility] ED yesterday for abdominal pain. Patient reports around a 2 day history of bilateral flank pain and what she described as chest pain. She believes the pain starts in her upper abdomen/lower chest and radiates through to her flanks. This is a intense aching pain and is exacerbated with inspiration. She has an associated decreased appetite with the pain but denied any fever, chills, nausea, emesis, SOB, CP, urinary changes, bowel changes, or juandice. She notes a history of similar in the past but was told it was musculoskeletal pain. No previous abdominal surgeries. Work up in the ED revealed a leukocytosis to 14.2K (which is slightly worse to 16.6K this morning), total bilirubin was normal on admission (now only mildly elevated at 1.5), otherwise laboratory work up is unremarkable. CT Abdomen/Pelvis was concerning for distention of the gallbladder with large cholelithiasis and some evidence of cholecystitis.   General surgery is consulted by emergency medicine provider Pia Mau, PA-C for evaluation and management of acute cholecystitis.    PAST MEDICAL HISTORY (PMH):  History reviewed. No pertinent past medical history.  Reviewed. Otherwise negative.   PAST SURGICAL HISTORY (PSH):  History reviewed. No pertinent surgical history.  Reviewed. Otherwise negative.   MEDICATIONS:  Prior to Admission medications   Not on File     ALLERGIES:  No Known Allergies   SOCIAL HISTORY:  Social History   Socioeconomic History   Marital status: Married    Spouse name: Not on file   Number of children: Not on file   Years of education: Not on file   Highest education level: Not on file  Occupational History   Not on file  Tobacco Use   Smoking status: Never Smoker   Smokeless tobacco: Never Used  Substance and Sexual Activity   Alcohol use: Not on file    Drug use: Not on file   Sexual activity: Not on file  Other Topics Concern   Not on file  Social History Narrative   Not on file   Social Determinants of Health   Financial Resource Strain:    Difficulty of Paying Living Expenses:   Food Insecurity:    Worried About Running Out of Food in the Last Year:    Barista in the Last Year:   Transportation Needs:    Freight forwarder (Medical):    Lack of Transportation (Non-Medical):   Physical Activity:    Days of Exercise per Week:    Minutes of Exercise per Session:   Stress:    Feeling of Stress :   Social Connections:    Frequency of Communication with Friends and Family:    Frequency of Social Gatherings with Friends and Family:    Attends Religious Services:    Active Member of Clubs or Organizations:    Attends Engineer, structural:    Marital Status:   Intimate Partner Violence:    Fear of Current or Ex-Partner:    Emotionally Abused:    Physically Abused:    Sexually Abused:      FAMILY HISTORY:  History reviewed. No pertinent family history.  Otherwise negative.   REVIEW OF SYSTEMS:  Review of Systems  Constitutional: Negative for chills and fever.  HENT: Negative for congestion and sore throat.   Respiratory: Negative for cough and shortness of breath.   Cardiovascular: Positive for chest pain. Negative for palpitations.  Gastrointestinal: Positive for abdominal pain and nausea. Negative for blood in stool, constipation, diarrhea and vomiting.  Genitourinary: Negative for dysuria and urgency.  All other systems reviewed and are negative.   VITAL SIGNS:  Temp:  [98 F (36.7 C)-98.7 F (37.1 C)] 98.1 F (36.7 C) (06/29 0439) Pulse Rate:  [64-117] 70 (06/29 0439) Resp:  [16-20] 16 (06/29 0439) BP: (129-175)/(67-89) 144/80 (06/29 0439) SpO2:  [95 %-98 %] 95 % (06/29 0439) Weight:  [74.8 kg] 74.8 kg (06/28 1536)     Height: 5\' 5"  (165.1 cm) Weight: 74.8 kg BMI (Calculated): 27.46    PHYSICAL EXAM:  Physical Exam Vitals and nursing note reviewed.  Constitutional:      General: She is not in acute distress.    Appearance: Normal appearance. She is normal weight. She is not ill-appearing.  HENT:     Head: Normocephalic and atraumatic.  Eyes:     General: No scleral icterus.    Conjunctiva/sclera: Conjunctivae normal.  Cardiovascular:     Rate and Rhythm: Normal rate and regular rhythm.     Pulses: Normal pulses.     Heart sounds: No murmur heard.   Pulmonary:     Effort: Pulmonary effort is normal. No respiratory distress.     Breath sounds: Normal breath sounds.  Abdominal:     General: There is no distension.     Palpations: Abdomen is soft.     Tenderness: There is abdominal tenderness in the right upper quadrant and epigastric area. There is no guarding or rebound. Positive signs include Murphy's sign.  Genitourinary:    Comments: Deferred Musculoskeletal:     Right lower leg: No edema.     Left lower leg: No edema.  Skin:    General: Skin is warm and dry.     Coloration: Skin is not jaundiced.     Findings: No erythema.  Neurological:     General: No focal deficit present.     Mental Status: She is alert and oriented to person, place, and time.  Psychiatric:        Mood and Affect: Mood normal.        Behavior: Behavior normal.     INTAKE/OUTPUT:  This shift: No intake/output data recorded.  Last 2 shifts: @IOLAST2SHIFTS @  Labs:  CBC Latest Ref Rng & Units 08/16/2019 08/15/2019  WBC 4.0 - 10.5 K/uL 16.6(H) 14.2(H)  Hemoglobin 12.0 - 15.0 g/dL 08/18/2019 08/17/2019  Hematocrit 36 - 46 % 39.1 40.2  Platelets 150 - 400 K/uL 249 266   CMP Latest Ref Rng & Units 08/16/2019 08/15/2019  Glucose 70 - 99 mg/dL 08/18/2019) 08/17/2019)  BUN 6 - 20 mg/dL 9 8  Creatinine 371(G - 1.00 mg/dL 626(R 4.85  Sodium 4.62 - 145 mmol/L 136 136  Potassium 3.5 - 5.1 mmol/L 4.1 3.9  Chloride 98 - 111 mmol/L 101 102  CO2 22 - 32 mmol/L 27 24  Calcium 8.9 - 10.3 mg/dL 9.2 9.0  Total  Protein 6.5 - 8.1 g/dL 7.8 8.0  Total Bilirubin 0.3 - 1.2 mg/dL 7.03) 1.0  Alkaline Phos 38 - 126 U/L 76 75  AST 15 - 41 U/L 19 19  ALT 0 - 44 U/L 18 20    Imaging studies:   CT Abdomen/Pelvis with CTA Chest (08/15/2019) personally reviewed showing cholelithiasis, gallbladder distension, and some areas of possible wall thickening concerning for cholecystitis, and radiologist interpretation reviewed below:  IMPRESSION: 1. Cholelithiasis with findings suspicious for acute cholecystitis. Follow-up with ultrasound is recommended.  2. No acute pulmonary embolism.  The lungs are clear.    Assessment/Plan: (ICD-10's: K81.0) 44 y.o. female with leukocytosis, abdominal pain concerning for likely calculus cholecystitis.    - Admit to general surgery  - Will plan on Laparoscopic Cholecystectomy with Dr Lady Gary today pending OR/Anesthesia availability   - All risks, benefits, and alternatives to above procedure(s) were discussed with the patient, all of her questions were answered to her expressed satisfaction, patient expresses she wishes to proceed, and informed consent was obtained.   - NPO + IVF Resuscitation  - IV ABx (Zosyn)  - Pain control prn; antiemetics prn  - Monitor abdominal examination  - Monitor Leukocytosis  - Monitor hyperbilirubinemia; mild; suspect this is secondary to compression from distended gallbladder  - DVT prophylaxis; hold for OR  All of the above findings and recommendations were discussed with the patient, and all of her questions were answered to her expressed satisfaction.  -- Lynden Oxford, PA-C [facility] 08/16/2019, 7:20 AM [phone] M-F: 7am - 4pm  I saw and evaluated the patient.  I agree with the above documentation, exam, and plan, which I have edited where appropriate. Duanne Guess  8:44 AM

## 2019-08-16 NOTE — Op Note (Signed)
Laparoscopic Cholecystectomy  Pre-operative Diagnosis: Acute calculus cholecystitis  Post-operative Diagnosis: Same  Procedure: Laparoscopic cholecystectomy  Surgeon: Duanne Guess, MD  Anesthesia: GETA  Assistant: None   Findings: There was a giant stone filling the majority of the gallbladder lumen.  There was inflammation as well as purulent bile, consistent with acute calculus cholecystitis   Estimated Blood Loss: Less than 10 cc         Drains: None         Specimens: Gallbladder           Complications: none   Procedure Details  The patient was seen again in the preoperative holding area. The benefits, complications, treatment options, and expected outcomes were discussed with the patient. The risks of bleeding, infection, recurrence of symptoms, failure to resolve symptoms, bile duct damage, bile duct leak, retained common bile duct stone, bowel injury, any of which could require further surgery and/or ERCP, stent, or papillotomy were reviewed with the patient. The likelihood of improving the patient's symptoms with return to their baseline status is good.  The patient and/or family concurred with the proposed plan, giving informed consent.  The patient was taken to operating room, identified as Abigail Chaney and the procedure verified as Laparoscopic Cholecystectomy. A time out was performed and the above information confirmed.  Prior to the induction of general anesthesia, antibiotic prophylaxis was administered. VTE prophylaxis was in place. General endotracheal anesthesia was then administered and tolerated well. After the induction, the abdomen was prepped with Chloraprep and draped in the sterile fashion. The patient was positioned in the supine position.  Optiview technique was used to enter the abdominal cavity via a 5 mm trocar in the midclavicular line, just below the costal margin.  Pneumoperitoneum was then created with CO2 and tolerated well without any  adverse changes in the patient's vital signs.  A 10 mm supraumbilical port was placed along with 2 additional 5-mm ports right upper quadrant ports, all under direct vision. All skin incisions  were infiltrated with a local anesthetic agent before making the incision and placing the trocars.   The patient was positioned  in reverse Trendelenburg, tilted slightly to the patient's left.  The gallbladder was identified.  It was grossly inflamed and distended.  I attempted to grasp the fundus, but was unable to.  I aspirated approximately 20 cc of purulent bile from the gallbladder, which enabled me to grasp the tissue.  The fundus was grasped and retracted cephalad. Adhesions were lysed bluntly. The infundibulum was grasped and retracted laterally, exposing the peritoneum overlying the triangle of Calot. This was then divided and exposed in a blunt fashion.  The tissues were extremely inflamed and oozed with minimal manipulation.  An extended critical view of the cystic duct and cystic artery was obtained.  The cystic duct was clearly identified and bluntly dissected free. Both the cystic artery and duct were double clipped and divided.  The gallbladder was taken from the gallbladder fossa in a retrograde fashion with the electrocautery. The gallbladder was removed and placed in an Endo pouch bag. The liver bed was irrigated and inspected. Hemostasis was achieved with the electrocautery. Copious saline irrigation was utilized and was repeatedly aspirated until clear.  Due to some minor raw surface oozing, Vistaseal was applied to the gallbladder fossa.  The gallbladder and Endo pouch sac were then removed through a port site.   Inspection of the right upper quadrant was performed. No bleeding, bile duct injury or leak, or bowel  injury was noted. Pneumoperitoneum was released.  The periumbilical port site was closed with interrumpted 0 Vicryl sutures. 4-0 subcuticular Monocryl was used to close the skin. Dermabond  was applied, followed by Steri-Strips.  The patient was then extubated and brought to the recovery room in stable condition. Sponge, lap, and needle counts were correct at closure and at the conclusion of the case.               Duanne Guess, MD, FACS

## 2019-08-16 NOTE — Progress Notes (Signed)
Patient doing well since procedure minimal pain 2-3 given tylenol. She has ambulated and had a urine episode.

## 2019-08-16 NOTE — Anesthesia Procedure Notes (Signed)
Procedure Name: Intubation Date/Time: 08/16/2019 11:13 AM Performed by: Doreen Salvage, CRNA Pre-anesthesia Checklist: Patient identified, Emergency Drugs available, Suction available and Patient being monitored Patient Re-evaluated:Patient Re-evaluated prior to induction Oxygen Delivery Method: Circle system utilized Preoxygenation: Pre-oxygenation with 100% oxygen Induction Type: IV induction, Cricoid Pressure applied and Rapid sequence Ventilation: Mask ventilation without difficulty Laryngoscope Size: Mac, 3 and McGraph Grade View: Grade II Tube type: Oral Tube size: 7.0 mm Number of attempts: 1 Airway Equipment and Method: Stylet Placement Confirmation: ETT inserted through vocal cords under direct vision,  positive ETCO2 and breath sounds checked- equal and bilateral Secured at: 21 cm Tube secured with: Tape Dental Injury: Teeth and Oropharynx as per pre-operative assessment

## 2019-08-16 NOTE — Anesthesia Postprocedure Evaluation (Signed)
Anesthesia Post Note  Patient: Abigail Chaney  Procedure(s) Performed: LAPAROSCOPIC CHOLECYSTECTOMY (N/A )  Patient location during evaluation: PACU Anesthesia Type: General Level of consciousness: awake and alert Pain management: pain level controlled Vital Signs Assessment: post-procedure vital signs reviewed and stable Respiratory status: spontaneous breathing, nonlabored ventilation, respiratory function stable and patient connected to nasal cannula oxygen Cardiovascular status: blood pressure returned to baseline and stable Postop Assessment: no apparent nausea or vomiting Anesthetic complications: no   No complications documented.   Last Vitals:  Vitals:   08/16/19 1005 08/16/19 1324  BP: (!) 143/80 (!) 104/58  Pulse: 78 74  Resp: 16 15  Temp: (!) 36.1 C (!) 36.1 C  SpO2: 98% 99%    Last Pain:  Vitals:   08/16/19 1324  TempSrc:   PainSc: Asleep                 Corinda Gubler

## 2019-08-16 NOTE — Anesthesia Preprocedure Evaluation (Addendum)
Anesthesia Evaluation  Patient identified by MRN, date of birth, ID band Patient awake    Reviewed: Allergy & Precautions, NPO status , Patient's Chart, lab work & pertinent test results  History of Anesthesia Complications Negative for: history of anesthetic complications  Airway Mallampati: III  TM Distance: >3 FB Neck ROM: Full    Dental no notable dental hx. (+) Teeth Intact, Dental Advisory Given   Pulmonary neg pulmonary ROS, neg sleep apnea, neg COPD, Patient abstained from smoking.Not current smoker,    Pulmonary exam normal breath sounds clear to auscultation       Cardiovascular Exercise Tolerance: Good METS(-) hypertension(-) CAD and (-) Past MI negative cardio ROS  (-) dysrhythmias  Rhythm:Regular Rate:Normal - Systolic murmurs    Neuro/Psych negative neurological ROS  negative psych ROS   GI/Hepatic neg GERD  ,(+)     (-) substance abuse  ,   Endo/Other  neg diabetes  Renal/GU negative Renal ROS     Musculoskeletal   Abdominal   Peds  Hematology   Anesthesia Other Findings History reviewed. No pertinent past medical history.  Reproductive/Obstetrics                             Anesthesia Physical Anesthesia Plan  ASA: II  Anesthesia Plan: General   Post-op Pain Management:    Induction: Intravenous, Rapid sequence and Cricoid pressure planned  PONV Risk Score and Plan: 4 or greater and Ondansetron, Dexamethasone, Propofol infusion, TIVA and Midazolam  Airway Management Planned: Oral ETT  Additional Equipment: None  Intra-op Plan:   Post-operative Plan: Extubation in OR  Informed Consent: I have reviewed the patients History and Physical, chart, labs and discussed the procedure including the risks, benefits and alternatives for the proposed anesthesia with the patient or authorized representative who has indicated his/her understanding and acceptance.      Dental advisory given and Interpreter used for interveiw  Plan Discussed with: CRNA and Surgeon  Anesthesia Plan Comments: (Discussed risks of anesthesia with patient, including PONV, sore throat, lip/dental damage. Rare risks discussed as well, such as cardiorespiratory and neurological sequelae. Patient understands.)       Anesthesia Quick Evaluation

## 2019-08-16 NOTE — Transfer of Care (Signed)
Immediate Anesthesia Transfer of Care Note  Patient: Abigail Chaney  Procedure(s) Performed: Procedure(s): LAPAROSCOPIC CHOLECYSTECTOMY (N/A)  Patient Location: PACU  Anesthesia Type:General  Level of Consciousness: sedated  Airway & Oxygen Therapy: Patient Spontanous Breathing and Patient connected to face mask oxygen  Post-op Assessment: Report given to RN and Post -op Vital signs reviewed and stable  Post vital signs: Reviewed and stable  Last Vitals:  Vitals:   08/16/19 1005 08/16/19 1324  BP: (!) 143/80 (!) 104/58  Pulse: 78 74  Resp: 16 15  Temp: (!) 36.1 C (!) 36.1 C  SpO2: 98% 99%    Complications: No apparent anesthesia complications

## 2019-08-17 LAB — CBC
HCT: 33.7 % — ABNORMAL LOW (ref 36.0–46.0)
Hemoglobin: 11.7 g/dL — ABNORMAL LOW (ref 12.0–15.0)
MCH: 30.2 pg (ref 26.0–34.0)
MCHC: 34.7 g/dL (ref 30.0–36.0)
MCV: 87.1 fL (ref 80.0–100.0)
Platelets: 223 10*3/uL (ref 150–400)
RBC: 3.87 MIL/uL (ref 3.87–5.11)
RDW: 12.6 % (ref 11.5–15.5)
WBC: 18.3 10*3/uL — ABNORMAL HIGH (ref 4.0–10.5)
nRBC: 0 % (ref 0.0–0.2)

## 2019-08-17 LAB — COMPREHENSIVE METABOLIC PANEL
ALT: 45 U/L — ABNORMAL HIGH (ref 0–44)
AST: 52 U/L — ABNORMAL HIGH (ref 15–41)
Albumin: 3.3 g/dL — ABNORMAL LOW (ref 3.5–5.0)
Alkaline Phosphatase: 71 U/L (ref 38–126)
Anion gap: 9 (ref 5–15)
BUN: 12 mg/dL (ref 6–20)
CO2: 24 mmol/L (ref 22–32)
Calcium: 8.5 mg/dL — ABNORMAL LOW (ref 8.9–10.3)
Chloride: 102 mmol/L (ref 98–111)
Creatinine, Ser: 0.61 mg/dL (ref 0.44–1.00)
GFR calc Af Amer: >60 mL/min (ref >60)
GFR calc non Af Amer: >60 mL/min (ref >60)
Glucose, Bld: 123 mg/dL — ABNORMAL HIGH (ref 70–99)
Potassium: 3.6 mmol/L (ref 3.5–5.1)
Sodium: 135 mmol/L (ref 135–145)
Total Bilirubin: 1 mg/dL (ref 0.3–1.2)
Total Protein: 6.8 g/dL (ref 6.5–8.1)

## 2019-08-17 LAB — SURGICAL PATHOLOGY

## 2019-08-17 MED ORDER — AMOXICILLIN-POT CLAVULANATE 875-125 MG PO TABS
1.0000 | ORAL_TABLET | Freq: Two times a day (BID) | ORAL | 0 refills | Status: AC
Start: 2019-08-17 — End: 2019-08-27

## 2019-08-17 MED ORDER — OXYCODONE HCL 5 MG PO TABS: 5.0000 mg | ORAL_TABLET | Freq: Four times a day (QID) | ORAL | 0 refills | Status: DC | PRN

## 2019-08-17 MED ORDER — IBUPROFEN 800 MG PO TABS: 800.0000 mg | ORAL_TABLET | Freq: Three times a day (TID) | ORAL | 0 refills | Status: DC | PRN

## 2019-08-17 NOTE — Progress Notes (Signed)
IV removed from patient. Discharge instructions given to patient via interpreter on a stick. Verbalized understanding. No further questions and no acute distress at this time. Work note and coupon for augmentin given to patient. Patient to call son to transport her home.

## 2019-08-17 NOTE — Care Management (Signed)
Provided good rx coupon for Augmentin

## 2019-08-17 NOTE — Discharge Instructions (Signed)
In addition to included general post-operative instructions for laparoscopic cholecystectomy,  Diet: Resume home diet. Recommend low-fat and non-greasy diet for the first few days post-operatively  Activity: No heavy lifting >15-20 pounds (children, pets, laundry, garbage) for at least 2 weeks, but light activity and walking are encouraged. Do not drive or drink alcohol if taking narcotic pain medications or having pain that might distract from driving.  Wound care: 2 days after surgery (07/01), you may shower/get incision wet with soapy water and pat dry (do not rub incisions), but no baths or submerging incision underwater until follow-up. Your steri-strips wll begin to fall off on their own in 10-14 days   Medications: Resume all home medications. For mild to moderate pain: acetaminophen (Tylenol) or ibuprofen/naproxen (if no kidney disease). Combining Tylenol with alcohol can substantially increase your risk of causing liver disease. Narcotic pain medications, if prescribed, can be used for severe pain, though may cause nausea, constipation, and drowsiness. Do not combine Tylenol and Percocet (or similar) within a 6 hour period as Percocet (and similar) contain(s) Tylenol. If you do not need the narcotic pain medication, you do not need to fill the prescription.  Call office [phone] / [phone]) at any time if any questions, worsening pain, fevers/chills, bleeding, drainage from incision site, or other concerns.

## 2019-08-17 NOTE — Progress Notes (Signed)
Patient sent out via wheelchair by staff to family waiting ride.

## 2019-08-17 NOTE — Discharge Summary (Addendum)
[facility] SURGICAL DISCHARGE SUMMARY  Patient ID: Abigail Chaney MRN: [id number] DOB/AGE: [birth year] 44 y.o.  Admit date: 08/15/2019 Discharge date: 08/17/2019  Discharge Diagnoses Patient Active Problem List   Diagnosis Date Noted   Acute calculous cholecystitis 08/15/2019    Consultants None  Procedures 08/16/2019:  Laparoscopic Cholecystectomy  HPI: 44 y.o. female presented to [facility] ED yesterday for abdominal pain. Patient reports around a 2 day history of bilateral flank pain and what she described as chest pain. She believes the pain starts in her upper abdomen/lower chest and radiates through to her flanks. This is a intense aching pain and is exacerbated with inspiration. She has an associated decreased appetite with the pain but denied any fever, chills, nausea, emesis, SOB, CP, urinary changes, bowel changes, or juandice. She notes a history of similar in the past but was told it was musculoskeletal pain. No previous abdominal surgeries. Work up in the ED revealed a leukocytosis to 14.2K (which is slightly worse to 16.6K this morning), total bilirubin was normal on admission (now only mildly elevated at 1.5), otherwise laboratory work up is unremarkable. CT Abdomen/Pelvis was concerning for distention of the gallbladder with large cholelithiasis and some evidence of cholecystitis.   Hospital Course: Informed consent was obtained and documented, and patient underwent uneventful Laparoscopic Cholecystectomy (Dr Lady Gary, 08/16/2019).  Post-operatively, patient's pain/symptoms improved/resolved and advancement of patient's diet and ambulation were well-tolerated. The remainder of patient's hospital course was essentially unremarkable, and discharge planning was initiated accordingly with patient safely able to be discharged home with appropriate discharge instructions, antibiotics (Augmentin x10 days), pain control, and outpatient follow-up after all of her questions  were answered to her expressed satisfaction.   Discharge Condition: Good   Physical Examination:  Constitutional: Well appearing female, NAD HEENT: No scleral icterus  Pulmonary: Normal effort, no respiratory distress Gastrointestinal: Soft, expected post-operative soreness, non-distended, no rebound/guarding Skin: Laparoscopic incisions are CDI with steri-strips, no erythema or drainage appreciable   Allergies as of 08/17/2019   No Known Allergies      Medication List     TAKE these medications    amoxicillin-clavulanate 875-125 MG tablet Commonly known as: Augmentin Take 1 tablet by mouth 2 (two) times daily for 10 days.   ibuprofen 800 MG tablet Commonly known as: ADVIL Take 1 tablet (800 mg total) by mouth every 8 (eight) hours as needed.   oxyCODONE 5 MG immediate release tablet Commonly known as: Oxy IR/ROXICODONE Take 1 tablet (5 mg total) by mouth every 6 (six) hours as needed for severe pain or breakthrough pain.          Follow-up Information     Donovan Kail, PA-C. Schedule an appointment as soon as possible for a visit in 2 week(s).   Specialty: Physician Assistant Why: s/p lap chole Contact information: [address] [place] [address] [phone]                  Time spent on discharge management including discussion of hospital course, clinical condition, outpatient instructions, prescriptions, and follow up with the patient and members of the medical team: >30 minutes  -- Lynden Oxford , PA-C [facility]  08/17/2019, 8:11 AM [phone] M-F: 7am - 4pm  I saw and evaluated the patient.  I agree with the above documentation, exam, and plan, which I have edited where appropriate. Duanne Guess  9:05 AM

## 2019-08-30 ENCOUNTER — Other Ambulatory Visit: Payer: Self-pay

## 2019-08-30 ENCOUNTER — Encounter: Payer: Self-pay | Admitting: Physician Assistant

## 2019-08-30 ENCOUNTER — Ambulatory Visit (INDEPENDENT_AMBULATORY_CARE_PROVIDER_SITE_OTHER): Payer: Self-pay | Admitting: Physician Assistant

## 2019-08-30 VITALS — BP 105/72 | HR 64 | Temp 98.4°F | Resp 12 | Ht 62.0 in | Wt 154.0 lb

## 2019-08-30 DIAGNOSIS — K8000 Calculus of gallbladder with acute cholecystitis without obstruction: Secondary | ICD-10-CM

## 2019-08-30 DIAGNOSIS — Z09 Encounter for follow-up examination after completed treatment for conditions other than malignant neoplasm: Secondary | ICD-10-CM

## 2019-08-30 NOTE — Progress Notes (Signed)
[facility] POST-OP OFFICE VISIT  08/30/2019  HPI: Abigail Chaney is a 44 y.o. female 14 days s/p laparoscopic cholecystectomy for acute cholecystitis with Dr Lady Gary  She is doing well No issues with pain, nausea, emesis, fever, chills No issues with incisions Tolerating PO, normal bowel function Mobilizing without issue She is anxious to return to work  Vital signs: BP 105/72   Pulse 64   Temp 98.4 F (36.9 C) (Oral)   Resp 12   Ht 5\' 2"  (1.575 m)   Wt 154 lb (69.9 kg)   SpO2 97%   BMI 28.17 kg/m    Physical Exam: Constitutional: Well appearing female, NAD Abdomen: Soft, non-tender, non-distended, no rebound/guarding Skin: Laparoscopic incisions are well healed, no erythema or drainage   Assessment/Plan: This is a 44 y.o. female 14 days s/p laparoscopic cholecystectomy for acute cholecystitis   - Pain control prn; tylenol/motrin  - Reviewed wound care  - Reviewed lifting restrictions; return to work note provided  - Reviewed pathology: Acute on Chronic cholecystitis  - rtc prn  -- 58, PA-C [facility] 08/30/2019, 11:04 AM [phone] M-F: 7am - 4pm

## 2019-08-30 NOTE — Patient Instructions (Addendum)

## 2020-11-16 ENCOUNTER — Encounter: Payer: Self-pay | Admitting: General Surgery

## 2021-06-04 ENCOUNTER — Ambulatory Visit: Payer: Self-pay

## 2021-07-10 ENCOUNTER — Other Ambulatory Visit: Payer: Self-pay

## 2021-07-10 DIAGNOSIS — Z1231 Encounter for screening mammogram for malignant neoplasm of breast: Secondary | ICD-10-CM

## 2021-07-10 DIAGNOSIS — Z1211 Encounter for screening for malignant neoplasm of colon: Secondary | ICD-10-CM

## 2021-07-17 ENCOUNTER — Ambulatory Visit
Admission: RE | Admit: 2021-07-17 | Discharge: 2021-07-17 | Disposition: A | Discharge status: Home / Self Care | Payer: Self-pay | Source: Ambulatory Visit | Attending: Obstetrics and Gynecology | Admitting: Obstetrics and Gynecology

## 2021-07-17 ENCOUNTER — Ambulatory Visit: Payer: Self-pay | Attending: Hematology and Oncology | Admitting: *Deleted

## 2021-07-17 VITALS — BP 111/68 | HR 58 | Resp 18 | Wt 163.0 lb

## 2021-07-17 DIAGNOSIS — Z1239 Encounter for other screening for malignant neoplasm of breast: Secondary | ICD-10-CM

## 2021-07-17 DIAGNOSIS — Z1231 Encounter for screening mammogram for malignant neoplasm of breast: Secondary | ICD-10-CM | POA: Insufficient documentation

## 2021-07-17 NOTE — Progress Notes (Signed)
[name] is a 46 y.o. female who presents to [facility] clinic today with complaint of right outer breast swelling and pain when touches x around once per month. Patient stated she no longer has menstrual periods and unsure if the pain occurs during the time of the month she used to have periods.    Pap Smear: Pap smear not completed today. Last Pap smear was February 2020 at [facility] clinic and was normal per patient. Per patient has no history of an abnormal Pap smear. Last Pap smear result is not available in Epic.   Physical exam: Breasts Breasts symmetrical. No skin abnormalities bilateral breasts. No nipple retraction bilateral breasts. No nipple discharge bilateral breasts. No lymphadenopathy. No lumps palpated bilateral breasts. No complaints of pain or tenderness on exam.      Pelvic/Bimanual Pap is not indicated today per BCCCP guidelines.   Smoking History: Patient has never smoked.  Patient Navigation: Patient education provided. Access to services provided for patient through [employer] program. Spanish interpreter Delos Haring from [facility] provided.   Colorectal Cancer Screening: Per patient has never had colonoscopy completed. Patient stated she completed a FIT Test recently given by her PCP that was negative. No complaints today.    Breast and Cervical Cancer Risk Assessment: Patient does not have family history of breast cancer, known genetic mutations, or radiation treatment to the chest before age 52. Patient does not have history of cervical dysplasia, immunocompromised, or DES exposure in-utero.  Risk Assessment     Risk Scores       07/17/2021   Last edited by: Lesle Chris, RN   5-year risk: 0.6 %   Lifetime risk: 6.8 %            A: BCCCP exam without pap smear Complaint of right outer breast pain and swelling x once per month.  P: Referred patient to the [facility] or a screening mammogram. Appointment  scheduled Wednesday, Jul 17, 2021 at 1200.  [name], RN 07/17/2021 11:37 AM

## 2021-07-17 NOTE — Patient Instructions (Signed)
Explained breast self awareness with Donnella Bi. Patient did not need a Pap smear today due to last Pap smear was February 2023 per patient. Let her know BCCCP will cover Pap smears every 3 years unless has a history of abnormal Pap smears. Referred patient to the [facility] or a screening mammogram. Appointment scheduled Wednesday, Jul 17, 2021 at 1200. Patient aware of appointment and will be there. Let patient know Delford Field will follow up with her within the next couple weeks with results of mammogram by letter or phone. Docia Chuck Cruz verbalized understanding.  Sakari Alkhatib, Kathaleen Maser, RN 11:37 AM

## 2022-09-02 IMAGING — MG MM DIGITAL SCREENING BILAT W/ TOMO AND CAD
6 of 12 series · 6 of 36 positions shown · non-contrast
Comparison: None available.

CLINICAL DATA: Screening.

EXAM:
DIGITAL SCREENING BILATERAL MAMMOGRAM WITH TOMOSYNTHESIS AND CAD
TECHNIQUE: Bilateral screening digital craniocaudal and mediolateral oblique
mammograms were obtained. Bilateral screening digital breast
tomosynthesis was performed. The images were evaluated with
computer-aided detection.

[L MLO synth-2D (1 of 2)]
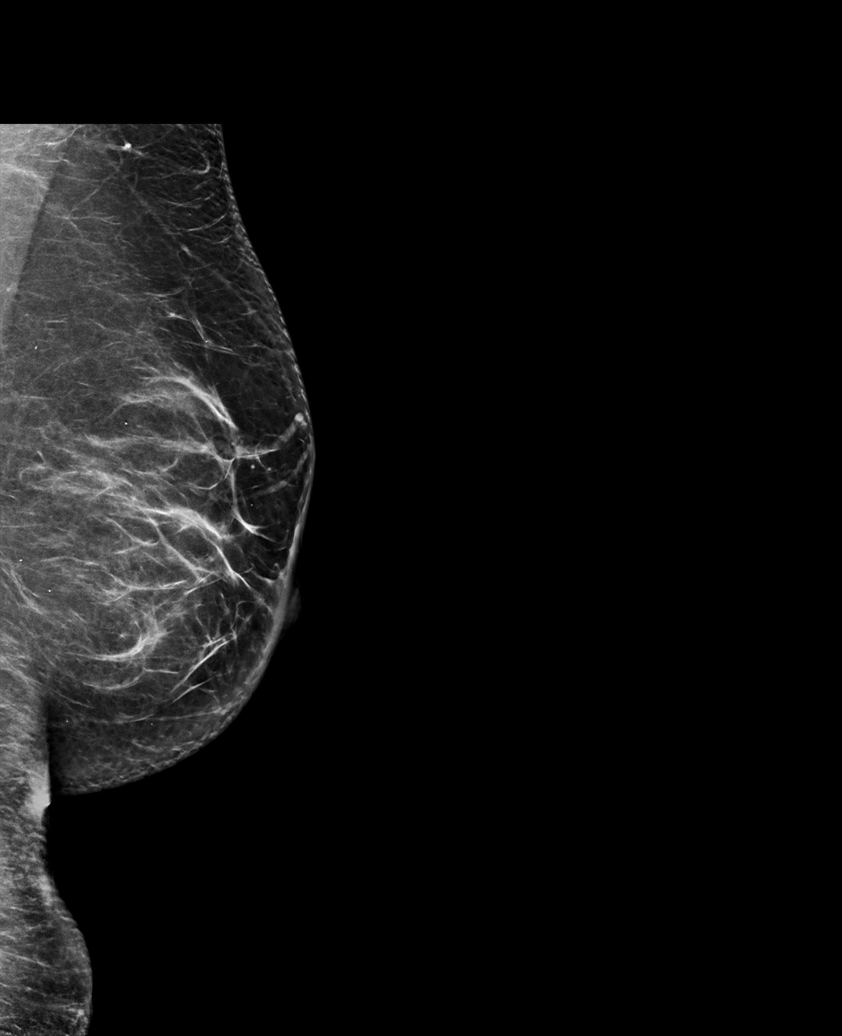

[L MLO synth-2D (2 of 2)]
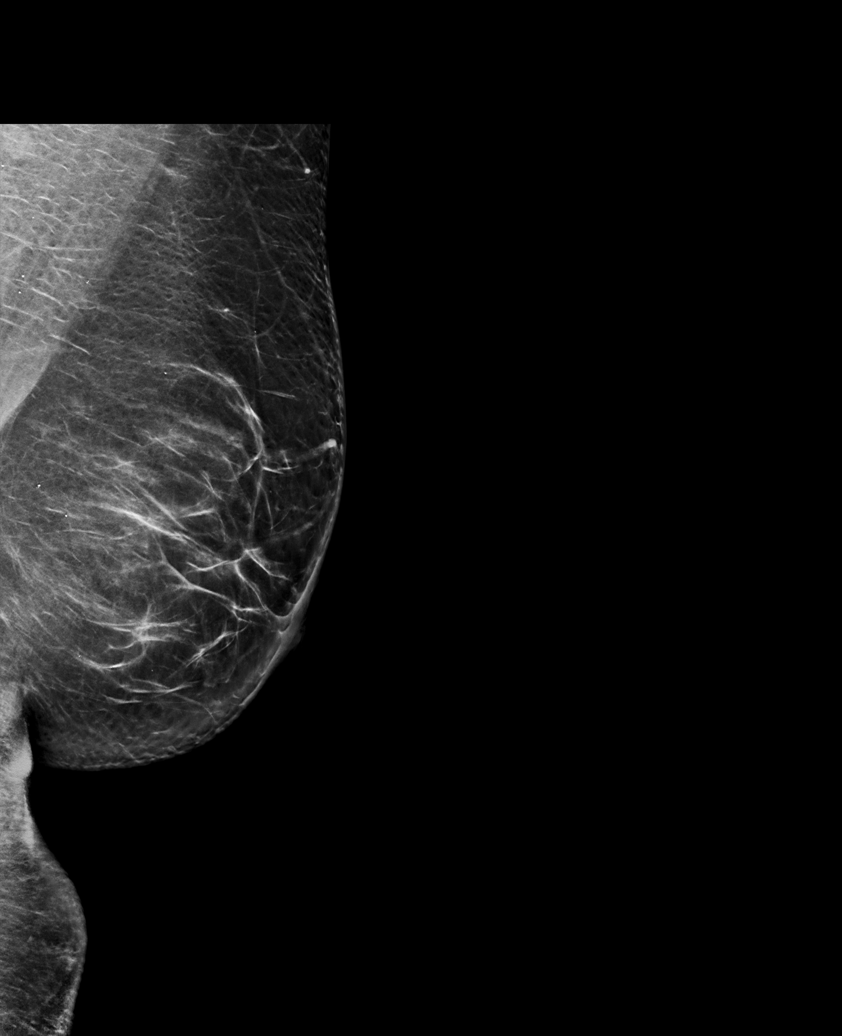

[R MLO synth-2D (1 of 2)]
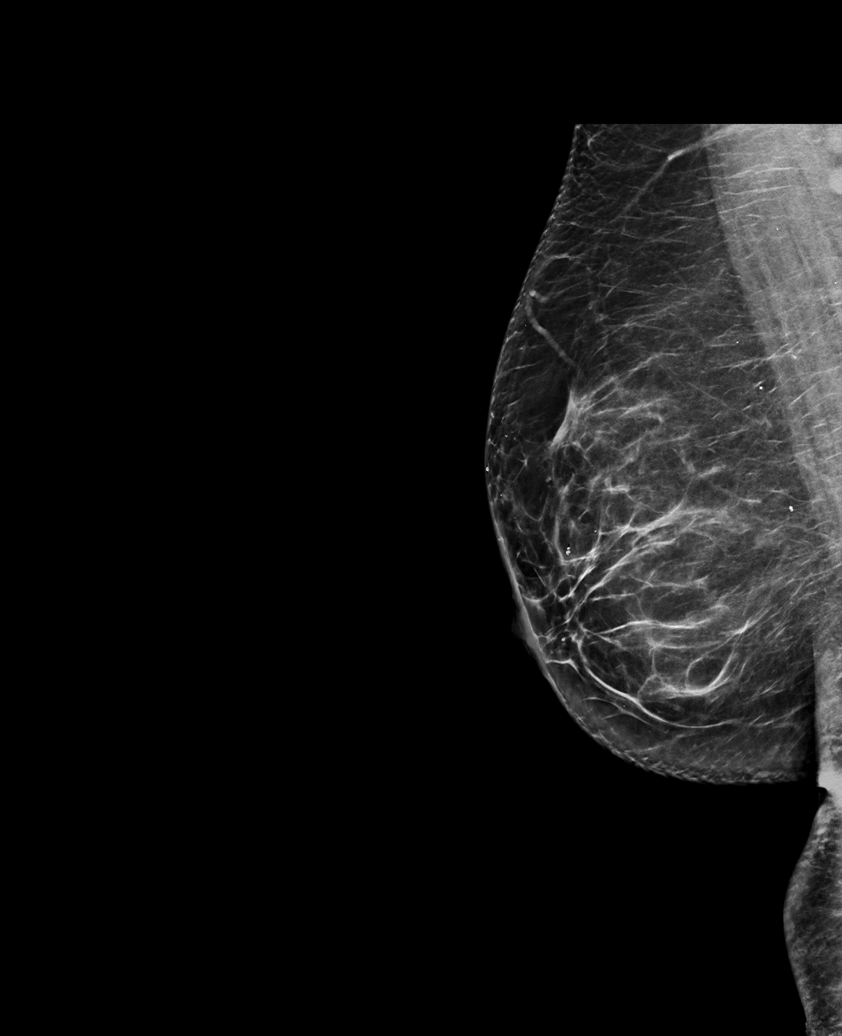

[R MLO synth-2D (2 of 2)]
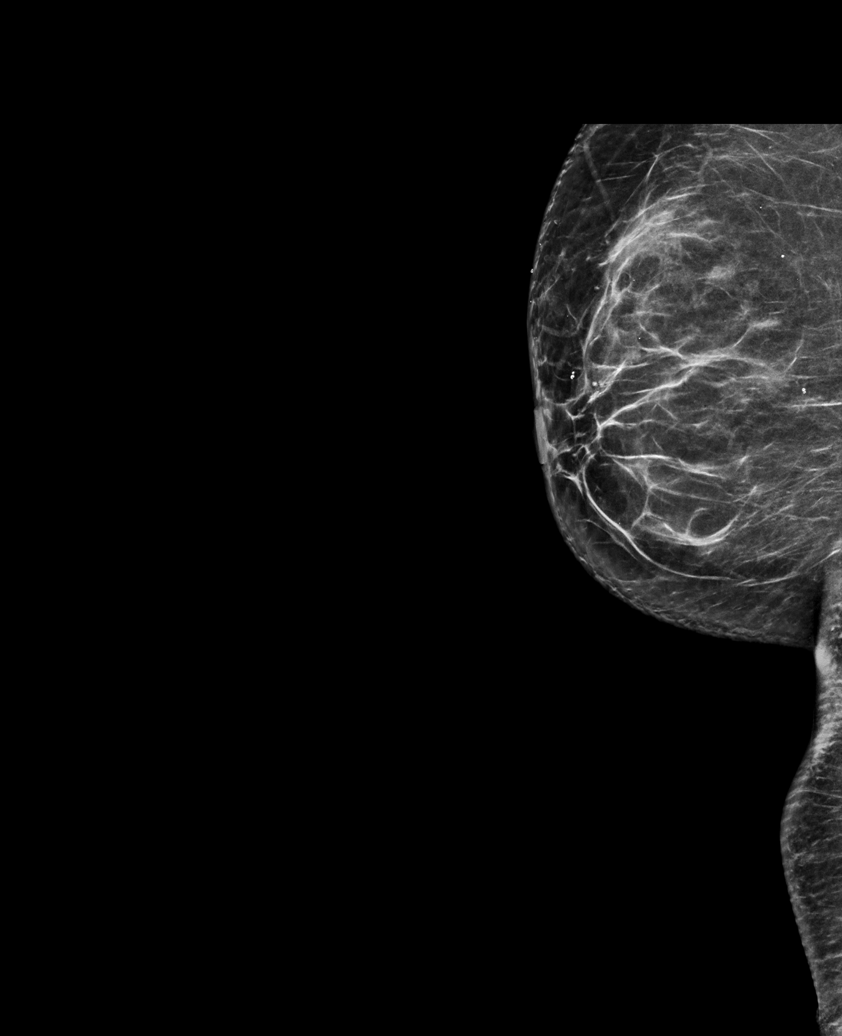

[R CC synth-2D]
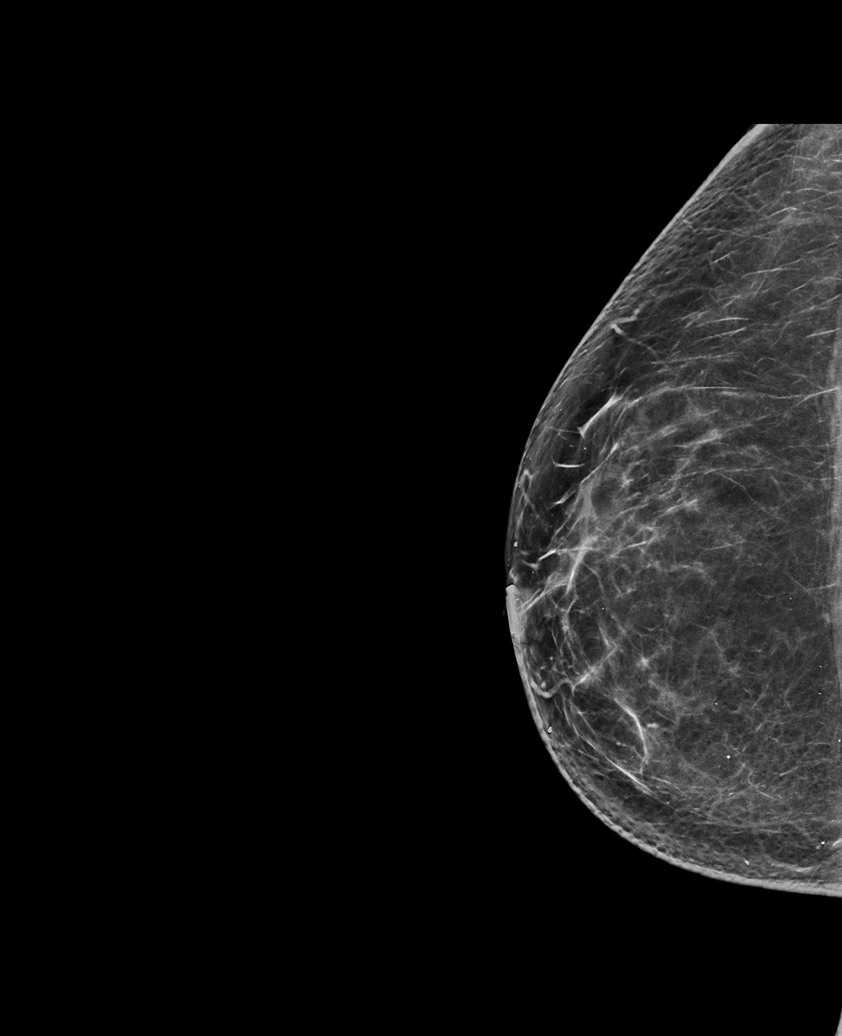

[L CC synth-2D]
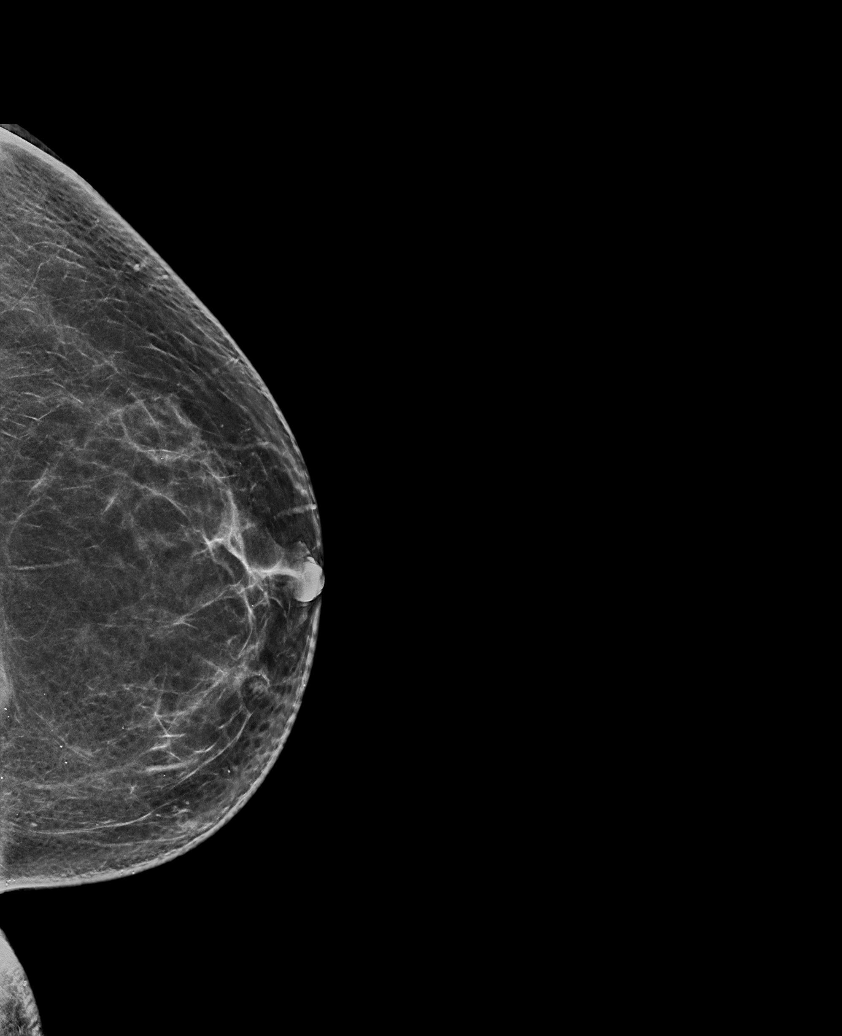

[6 of 36 positions shown; findings below may reference images not displayed]

ACR Breast Density Category b: There are scattered areas of
fibroglandular density.
FINDINGS: There are no findings suspicious for malignancy.
IMPRESSION: No mammographic evidence of malignancy. A result letter of this
screening mammogram will be mailed directly to the patient.

RECOMMENDATION:
Screening mammogram in one year. (Code:GB-Z-FIU)

BI-RADS CATEGORY  1: Negative.

## 2023-08-19 ENCOUNTER — Encounter: Payer: Self-pay | Admitting: Family Medicine

## 2023-08-19 ENCOUNTER — Telehealth: Payer: Self-pay | Admitting: *Deleted

## 2023-09-02 ENCOUNTER — Other Ambulatory Visit: Payer: Self-pay | Admitting: Family Medicine

## 2023-09-02 DIAGNOSIS — Z1231 Encounter for screening mammogram for malignant neoplasm of breast: Secondary | ICD-10-CM

## 2023-09-15 ENCOUNTER — Ambulatory Visit
Admission: RE | Admit: 2023-09-15 | Discharge: 2023-09-15 | Disposition: A | Discharge status: Home / Self Care | Payer: Self-pay | Source: Ambulatory Visit | Attending: Family Medicine | Admitting: Family Medicine

## 2023-09-15 DIAGNOSIS — Z1231 Encounter for screening mammogram for malignant neoplasm of breast: Secondary | ICD-10-CM | POA: Insufficient documentation
